# Patient Record
Sex: Male | Born: 1985 | Race: Black or African American | Hispanic: No | Marital: Married | Smoking: Never smoker
Health system: Southern US, Community
[De-identification: ages and names within clinical notes are randomized; demographics above are authoritative.]

---

## 2019-05-11 ENCOUNTER — Encounter (HOSPITAL_COMMUNITY): Payer: Self-pay

## 2019-05-11 ENCOUNTER — Ambulatory Visit (HOSPITAL_COMMUNITY)
Admission: EM | Admit: 2019-05-11 | Discharge: 2019-05-11 | Disposition: A | Discharge status: Home / Self Care | Payer: Self-pay | Attending: Family Medicine | Admitting: Family Medicine

## 2019-05-11 ENCOUNTER — Other Ambulatory Visit: Payer: Self-pay

## 2019-05-11 ENCOUNTER — Ambulatory Visit (INDEPENDENT_AMBULATORY_CARE_PROVIDER_SITE_OTHER): Payer: Self-pay

## 2019-05-11 DIAGNOSIS — L84 Corns and callosities: Secondary | ICD-10-CM

## 2019-05-11 NOTE — ED Triage Notes (Signed)
Pt presents with foreign body in left foot from over a month ago. Pt is unsure of whether it is glass or something else.

## 2019-05-11 NOTE — Discharge Instructions (Signed)
Xray does not show any visible foreign bodies.  This cannot rule out anything completely, as not all things are detectable by xray.  However, since this has been so long, I do not feel that without confirmation we should be cutting into your foot here today to attempt removal.  This does appear that it may be more like a plantar wart and callus.  Soaks of the foot at night, use of over the counter wart treatment may be tried as well.  I would recommend following up with your primary care provider and/or podiatry if your symptoms persist.

## 2019-05-11 NOTE — ED Provider Notes (Signed)
[facility]    CSN: [id number] Arrival date & time: 05/11/19  5329     History   Chief Complaint Chief Complaint  Patient presents with  . Foreign Body in Foot    HPI Clarence Miller is a 33 y.o. male.   Clarence Miller presents with complaints of pain to plantar aspect of left foot. States some "months" ago he felt that he stepped on something, had pain at the time and maybe even some bleeding. He never saw what he stepped on. He felt like something imbedded into the foot, he tried to remove it with tweezers at that time but was unable to remove anything. States pain has since been increasing. No redness, no drainage. No fevers. Pain with weight bearing. States he is on his feet a lot at work, works for [employer]. Another area near by is now also becoming thick and painful. Denies any previous similar.     ROS per HPI, negative if not otherwise mentioned.      History reviewed. No pertinent past medical history.  There are no active problems to display for this patient.   History reviewed. No pertinent surgical history.     Home Medications    Prior to Admission medications   Not on File    Family History Family History  Family history unknown: Yes    Social History Social History   Tobacco Use  . Smoking status: Never Smoker  . Smokeless tobacco: Never Used  Substance Use Topics  . Alcohol use: Not on file  . Drug use: Not on file     Allergies   Patient has no known allergies.   Review of Systems Review of Systems   Physical Exam Triage Vital Signs ED Triage Vitals  Enc Vitals Group     BP 05/11/19 0914 128/79     Pulse Rate 05/11/19 0914 68     Resp 05/11/19 0914 18     Temp 05/11/19 0914 98.2 F (36.8 C)     Temp Source 05/11/19 0914 Oral     SpO2 05/11/19 0914 97 %     Weight --      Height --      Head Circumference --      Peak Flow --      Pain Score 05/11/19 0915 3     Pain Loc --      Pain Edu? --      Excl. in [place]? --     No data found.  Updated Vital Signs BP 128/79 (BP Location: Right Arm)   Pulse 68   Temp 98.2 F (36.8 C) (Oral)   Resp 18   SpO2 97%    Physical Exam Constitutional:      Appearance: He is well-developed.  Cardiovascular:     Rate and Rhythm: Normal rate.     Pulses:          Dorsalis pedis pulses are 2+ on the left side.  Pulmonary:     Effort: Pulmonary effort is normal.  Musculoskeletal:     Left foot: Normal range of motion.       Feet:  Feet:     Left foot:     Skin integrity: Callus present.     Comments: Two regions of apparent callous to left foot which are tender; more medial area is circular;no drainage or fluctuance; no redness; not raised; firm; no visible or obvious palpable foreign body; callous noted to plantar aspect of great toe  and heel as well Skin:    General: Skin is warm and dry.  Neurological:     Mental Status: He is alert and oriented to person, place, and time.      UC Treatments / Results  Labs (all labs ordered are listed, but only abnormal results are displayed) Labs Reviewed - No data to display  EKG   Radiology Dg Foot Complete Left  Result Date: 05/11/2019 CLINICAL DATA:  Left foot pain for 2 months. Possible foreign body. Stepped on an object. EXAM: LEFT FOOT - COMPLETE 3+ VIEW COMPARISON:  None. FINDINGS: The joint spaces are maintained. No acute bony findings or destructive bony changes. No radiopaque foreign body or gas in the soft tissues. IMPRESSION: No acute bony findings or radiopaque foreign body. Electronically Signed   By: Rudie MeyerP.  Gallerani M.D.   On: 05/11/2019 09:41    Procedures Procedures (including critical care time)  Medications Ordered in UC Medications - No data to display  Initial Impression / Assessment and Plan / UC Course  I have reviewed the triage vital signs and the nursing notes.  Pertinent labs & imaging results that were available during my care of the patient were reviewed by me and considered  in my medical decision making (see chart for details).     No visible FB on xray, doesn't completely rule out possibility, discussed this with patient. Recommended starting out with callous and wart treatment to see if any benefits, follow up with podiatry recommended. Patient verbalized understanding and agreeable to plan.  Ambulatory out of clinic without difficulty.    Final Clinical Impressions(s) / UC Diagnoses   Final diagnoses:  Callus of foot     Discharge Instructions     Xray does not show any visible foreign bodies.  This cannot rule out anything completely, as not all things are detectable by xray.  However, since this has been so long, I do not feel that without confirmation we should be cutting into your foot here today to attempt removal.  This does appear that it may be more like a plantar wart and callus.  Soaks of the foot at night, use of over the counter wart treatment may be tried as well.  I would recommend following up with your primary care provider and/or podiatry if your symptoms persist.     ED Prescriptions    None     Controlled Substance Prescriptions [place] Controlled Substance Registry consulted? Not Applicable   Georgetta HaberBurky, [place] L[place] B, NP 05/11/19 1045

## 2020-06-20 IMAGING — DX LEFT FOOT - COMPLETE 3+ VIEW
3 series · 3 of 3 positions shown · non-contrast
Comparison: None.

CLINICAL DATA: Left foot pain for 2 months. Possible foreign body.
Stepped on an object.

EXAM:
LEFT FOOT - COMPLETE 3+ VIEW

[foot ap]
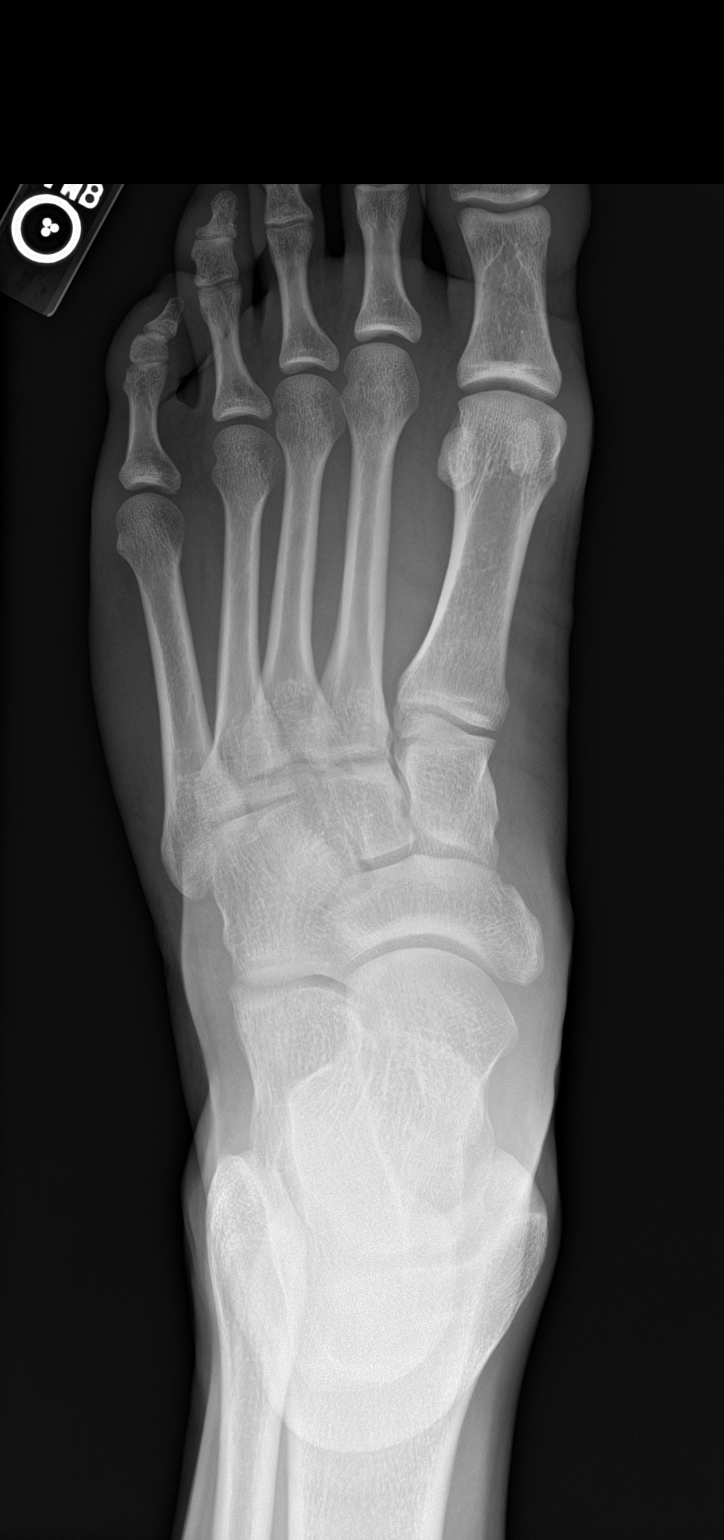

[foot obl]
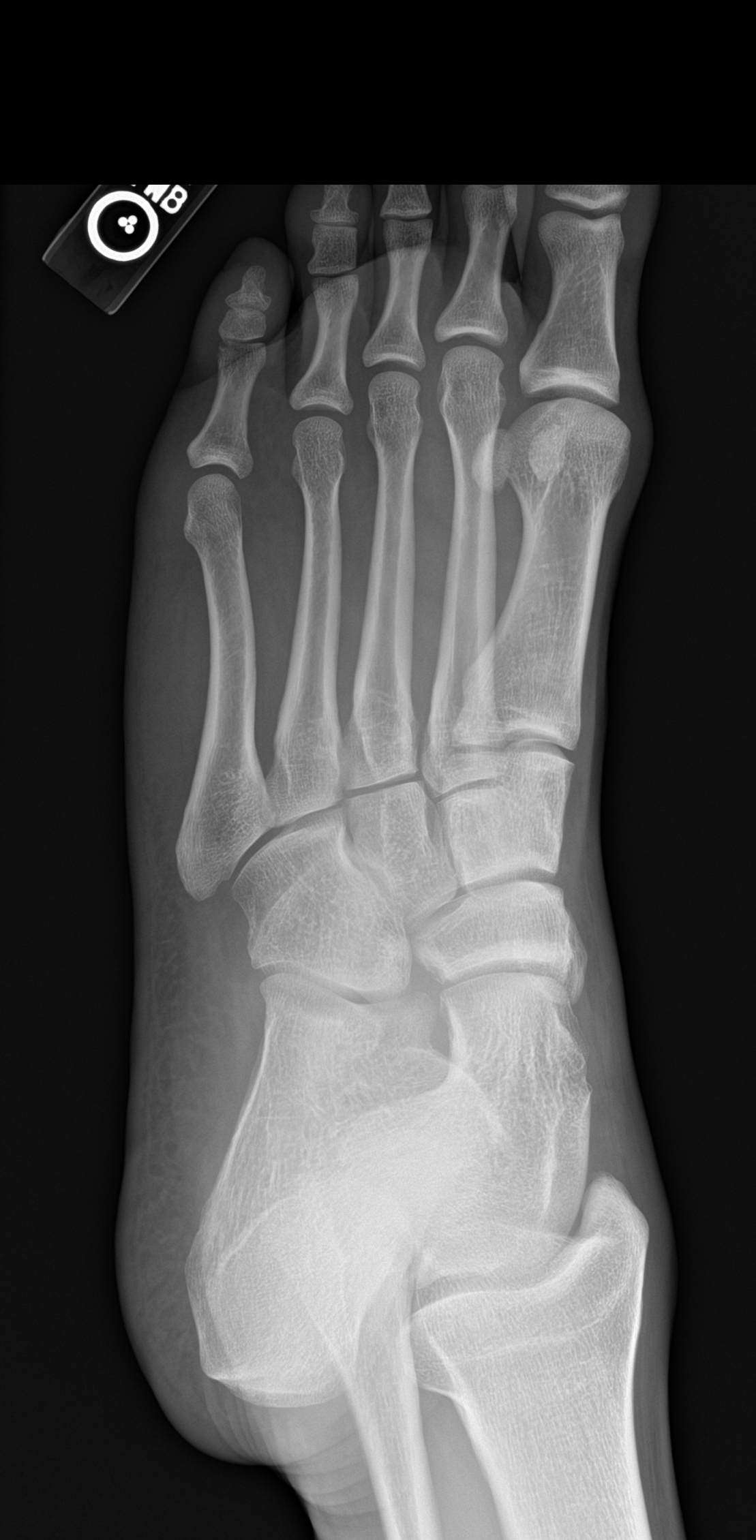

[foot lat]
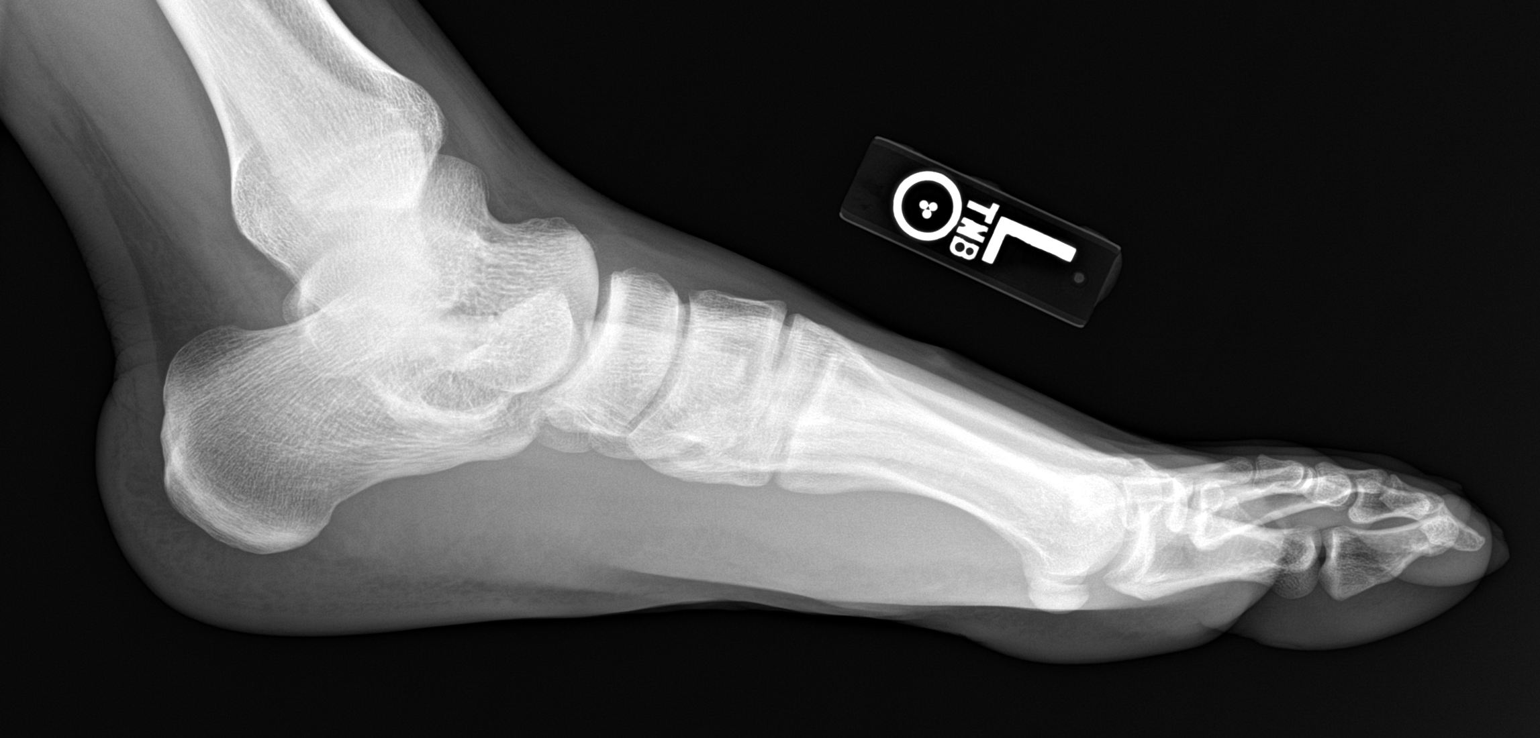

[3 of 3 positions shown; findings below may reference images not displayed]

FINDINGS: The joint spaces are maintained. No acute bony findings or
destructive bony changes. No radiopaque foreign body or gas in the
soft tissues.
IMPRESSION: No acute bony findings or radiopaque foreign body.

## 2021-06-19 DIAGNOSIS — J309 Allergic rhinitis, unspecified: Secondary | ICD-10-CM | POA: Diagnosis not present

## 2021-10-07 ENCOUNTER — Encounter: Payer: Self-pay | Admitting: Nurse Practitioner

## 2021-10-07 ENCOUNTER — Ambulatory Visit (INDEPENDENT_AMBULATORY_CARE_PROVIDER_SITE_OTHER): Payer: Federal, State, Local not specified - PPO | Admitting: Nurse Practitioner

## 2021-10-07 ENCOUNTER — Other Ambulatory Visit: Payer: Self-pay

## 2021-10-07 VITALS — BP 110/88 | HR 64 | Temp 97.7°F | Ht 71.5 in | Wt 215.2 lb

## 2021-10-07 DIAGNOSIS — H53009 Unspecified amblyopia, unspecified eye: Secondary | ICD-10-CM | POA: Insufficient documentation

## 2021-10-07 DIAGNOSIS — Z136 Encounter for screening for cardiovascular disorders: Secondary | ICD-10-CM | POA: Diagnosis not present

## 2021-10-07 DIAGNOSIS — Z0000 Encounter for general adult medical examination without abnormal findings: Secondary | ICD-10-CM | POA: Diagnosis not present

## 2021-10-07 DIAGNOSIS — H5210 Myopia, unspecified eye: Secondary | ICD-10-CM | POA: Insufficient documentation

## 2021-10-07 DIAGNOSIS — F419 Anxiety disorder, unspecified: Secondary | ICD-10-CM | POA: Insufficient documentation

## 2021-10-07 DIAGNOSIS — F902 Attention-deficit hyperactivity disorder, combined type: Secondary | ICD-10-CM | POA: Insufficient documentation

## 2021-10-07 DIAGNOSIS — H52229 Regular astigmatism, unspecified eye: Secondary | ICD-10-CM | POA: Insufficient documentation

## 2021-10-07 DIAGNOSIS — Q120 Congenital cataract: Secondary | ICD-10-CM

## 2021-10-07 DIAGNOSIS — Z13220 Encounter for screening for lipoid disorders: Secondary | ICD-10-CM | POA: Diagnosis not present

## 2021-10-07 DIAGNOSIS — J302 Other seasonal allergic rhinitis: Secondary | ICD-10-CM | POA: Insufficient documentation

## 2021-10-07 DIAGNOSIS — G4700 Insomnia, unspecified: Secondary | ICD-10-CM | POA: Insufficient documentation

## 2021-10-07 HISTORY — DX: Congenital cataract: Q12.0

## 2021-10-07 HISTORY — DX: Anxiety disorder, unspecified: F41.9

## 2021-10-07 NOTE — Progress Notes (Signed)
Subjective:    Patient ID: Clarence Miller, male    DOB: 05/07/1986, 36 y.o.   MRN: [id number]  Patient presents today for CPE  HPI  [facility].  Vision:up to date Dental:will schedule Diet:regular Exercise:4x/week, cardio and weight training Weight:  Wt Readings from Last 3 Encounters:  10/07/21 215 lb 3.2 oz (97.6 kg)    Sexual History (orientation,birth control, marital status, STD):no need for STD screen  Depression/Suicide: Depression screen [facility] 2/9 10/07/2021  Decreased Interest 0  Down, Depressed, Hopeless 0  PHQ - 2 Score 0   Immunizations: (TDAP, Hep C screen, Pneumovax, Influenza, zoster)  Health Maintenance  Topic Date Due   Pneumococcal Vaccination (1 - PCV) Never done   COVID-19 Vaccine (3 - Booster for Pfizer series) 04/16/2020   Hepatitis C Screening: USPSTF Recommendation to screen - Ages 18-79 yo.  10/07/2022*   HIV Screening  10/07/2022*   Tetanus Vaccine  03/08/2030   Flu Shot  Completed   HPV Vaccine  Aged Out  *Topic was postponed. The date shown is not the original due date.   Fall Risk: Fall Risk  10/07/2021  Falls in the past year? 0  Number falls in past yr: 0  Injury with Fall? 0  Risk for fall due to : No Fall Risks  Follow up Falls evaluation completed   Medications and allergies reviewed with patient and updated if appropriate.  Patient Active Problem List   Diagnosis Date Noted   Attention deficit hyperactivity disorder, combined type 10/07/2021   Amblyopia 10/07/2021   Anxiety disorder, unspecified 10/07/2021   Congenital cataract 10/07/2021   Insomnia 10/07/2021   Myopia 10/07/2021   Seasonal allergic rhinitis 10/07/2021   Regular astigmatism 10/07/2021    Current Outpatient Medications on File Prior to Visit  Medication Sig Dispense Refill   baclofen (LIORESAL) 10 MG tablet Take 10 mg by mouth 3 (three) times daily.     loratadine (CLARITIN) 10 MG tablet Take 1 tablet by mouth daily.      No current facility-administered medications on file prior to visit.   Past Medical History:  Diagnosis Date   Anxiety disorder, unspecified 10/07/2021   Congenital cataract 10/07/2021   No past surgical history on file.  Social History   Socioeconomic History   Marital status: Married    Spouse name: Not on file   Number of children: Not on file   Years of education: Not on file   Highest education level: Not on file  Occupational History   Not on file  Tobacco Use   Smoking status: Never   Smokeless tobacco: Never  Substance and Sexual Activity   Alcohol use: Never   Drug use: Never   Sexual activity: Yes    Birth control/protection: Coitus interruptus  Other Topics Concern   Not on file  Social History Narrative   Not on file   Social Determinants of Health   Financial Resource Strain: Not on file  Food Insecurity: Not on file  Transportation Needs: Not on file  Physical Activity: Not on file  Stress: Not on file  Social Connections: Not on file   Family History  Family history unknown: Yes       Review of Systems  Constitutional:  Negative for fever, malaise/fatigue and weight loss.  HENT:  Negative for congestion and sore throat.   Eyes:        Negative for visual changes  Respiratory:  Negative for cough  and shortness of breath.   Cardiovascular:  Negative for chest pain, palpitations and leg swelling.  Gastrointestinal:  Negative for blood in stool, constipation, diarrhea and heartburn.  Genitourinary:  Negative for dysuria, frequency and urgency.  Musculoskeletal:  Negative for falls, joint pain and myalgias.  Skin:  Negative for rash.  Neurological:  Negative for dizziness, sensory change and headaches.  Endo/Heme/Allergies:  Does not bruise/bleed easily.  Psychiatric/Behavioral:  Negative for depression, substance abuse and suicidal ideas. The patient is not nervous/anxious.    Objective:   Vitals:   10/07/21 1013  BP: 110/88  Pulse: 64   Temp: 97.7 F (36.5 C)  SpO2: 98%   Body mass index is 29.6 kg/m.  Physical Examination:  Physical Exam Vitals reviewed.  Constitutional:      General: He is not in acute distress.    Appearance: He is well-developed.  HENT:     Right Ear: Tympanic membrane, ear canal and external ear normal.     Left Ear: Tympanic membrane, ear canal and external ear normal.  Eyes:     Extraocular Movements: Extraocular movements intact.     Conjunctiva/sclera: Conjunctivae normal.  Cardiovascular:     Rate and Rhythm: Normal rate and regular rhythm.     Pulses: Normal pulses.     Heart sounds: Normal heart sounds.  Pulmonary:     Effort: Pulmonary effort is normal. No respiratory distress.     Breath sounds: Normal breath sounds.  Chest:     Chest wall: No tenderness.  Abdominal:     General: Bowel sounds are normal.     Palpations: Abdomen is soft.  Musculoskeletal:        General: Normal range of motion.     Cervical back: Normal range of motion and neck supple.     Right lower leg: No edema.     Left lower leg: No edema.  Skin:    General: Skin is warm and dry.  Neurological:     Mental Status: He is alert and oriented to person, place, and time.     Deep Tendon Reflexes: Reflexes are normal and symmetric.  Psychiatric:        Mood and Affect: Mood normal.        Behavior: Behavior normal.        Thought Content: Thought content normal.   ASSESSMENT and PLAN: This visit occurred during the SARS-CoV-2 public health emergency.  Safety protocols were in place, including screening questions prior to the visit, additional usage of staff PPE, and extensive cleaning of exam room while observing appropriate contact time as indicated for disinfecting solutions.   Clarence Miller was seen today for establish care.  Diagnoses and all orders for this visit:  Preventative health care -     CBC with Differential/Platelet; Future -     Comprehensive metabolic panel; Future -     TSH; Future -      Lipid panel; Future  Encounter for lipid screening for cardiovascular disease -     Lipid panel; Future      Problem List Items Addressed This Visit   None Visit Diagnoses     Preventative health care    -  Primary   Relevant Orders   CBC with Differential/Platelet   Comprehensive metabolic panel   TSH   Lipid panel   Encounter for lipid screening for cardiovascular disease       Relevant Orders   Lipid panel       Follow up:  Return in about 1 year (around 10/07/2022) for CPE (fasting).  Alysia Penna, NP

## 2021-10-07 NOTE — Patient Instructions (Signed)
Thank you for choosing  primary care  Schedule lab appt for fasting blood draw. Need to be fasting 8hrs prior to blood draw. Ok to drink water  Preventive Care 6-34 Years Old, Male Preventive care refers to lifestyle choices and visits with your health care provider that can promote health and wellness. Preventive care visits are also called wellness exams. What can I expect for my preventive care visit? Counseling During your preventive care visit, your health care provider may ask about your: Medical history, including: Past medical problems. Family medical history. Current health, including: Emotional well-being. Home life and relationship well-being. Sexual activity. Lifestyle, including: Alcohol, nicotine or tobacco, and drug use. Access to firearms. Diet, exercise, and sleep habits. Safety issues such as seatbelt and bike helmet use. Sunscreen use. Work and work Astronomer. Physical exam Your health care provider may check your: Height and weight. These may be used to calculate your BMI (body mass index). BMI is a measurement that tells if you are at a healthy weight. Waist circumference. This measures the distance around your waistline. This measurement also tells if you are at a healthy weight and may help predict your risk of certain diseases, such as type 2 diabetes and high blood pressure. Heart rate and blood pressure. Body temperature. Skin for abnormal spots. What immunizations do I need? Vaccines are usually given at various ages, according to a schedule. Your health care provider will recommend vaccines for you based on your age, medical history, and lifestyle or other factors, such as travel or where you work. What tests do I need? Screening Your health care provider may recommend screening tests for certain conditions. This may include: Lipid and cholesterol levels. Diabetes screening. This is done by checking your blood sugar (glucose) after you have  not eaten for a while (fasting). Hepatitis B test. Hepatitis C test. HIV (human immunodeficiency virus) test. STI (sexually transmitted infection) testing, if you are at risk. Talk with your health care provider about your test results, treatment options, and if necessary, the need for more tests. Follow these instructions at home: Eating and drinking  Eat a healthy diet that includes fresh fruits and vegetables, whole grains, lean protein, and low-fat dairy products. Drink enough fluid to keep your urine pale yellow. Take vitamin and mineral supplements as recommended by your health care provider. Do not drink alcohol if your health care provider tells you not to drink. If you drink alcohol: Limit how much you have to 0-2 drinks a day. Know how much alcohol is in your drink. In the [place] one drink equals one 12 oz bottle of beer (355 mL), one 5 oz glass of wine (148 mL), or one 1 oz glass of hard liquor (44 mL). Lifestyle Brush your teeth every morning and night with fluoride toothpaste. Floss one time each day. Exercise for at least 30 minutes 5 or more days each week. Do not use any products that contain nicotine or tobacco. These products include cigarettes, chewing tobacco, and vaping devices, such as e-cigarettes. If you need help quitting, ask your health care provider. Do not use drugs. If you are sexually active, practice safe sex. Use a condom or other form of protection to prevent STIs. Find healthy ways to manage stress, such as: Meditation, yoga, or listening to music. Journaling. Talking to a trusted person. Spending time with friends and family. Minimize exposure to UV radiation to reduce your risk of skin cancer. Safety Always wear your seat belt while driving or riding in  a vehicle. Do not drive: If you have been drinking alcohol. Do not ride with someone who has been drinking. If you have been using any mind-altering substances or drugs. While texting. When you  are tired or distracted. Wear a helmet and other protective equipment during sports activities. If you have firearms in your house, make sure you follow all gun safety procedures. Seek help if you have been physically or sexually abused. What's next? Go to your health care provider once a year for an annual wellness visit. Ask your health care provider how often you should have your eyes and teeth checked. Stay up to date on all vaccines. This information is not intended to replace advice given to you by your health care provider. Make sure you discuss any questions you have with your health care provider. Document Revised: 03/18/2021 Document Reviewed: 03/18/2021 Elsevier Patient Education  2022 [employer].

## 2021-10-08 ENCOUNTER — Other Ambulatory Visit (INDEPENDENT_AMBULATORY_CARE_PROVIDER_SITE_OTHER): Payer: Federal, State, Local not specified - PPO

## 2021-10-08 DIAGNOSIS — Z0000 Encounter for general adult medical examination without abnormal findings: Secondary | ICD-10-CM

## 2021-10-08 DIAGNOSIS — Z13220 Encounter for screening for lipoid disorders: Secondary | ICD-10-CM | POA: Diagnosis not present

## 2021-10-08 DIAGNOSIS — Z136 Encounter for screening for cardiovascular disorders: Secondary | ICD-10-CM

## 2021-10-08 LAB — COMPREHENSIVE METABOLIC PANEL
ALT: 26 U/L (ref 0–53)
AST: 22 U/L (ref 0–37)
Albumin: 4.4 g/dL (ref 3.5–5.2)
Alkaline Phosphatase: 41 U/L (ref 39–117)
BUN: 16 mg/dL (ref 6–23)
CO2: 27 mEq/L (ref 19–32)
Calcium: 9.3 mg/dL (ref 8.4–10.5)
Chloride: 102 mEq/L (ref 96–112)
Creatinine, Ser: 1.24 mg/dL (ref 0.40–1.50)
GFR: 76.26 mL/min (ref >60.00)
Glucose, Bld: 81 mg/dL (ref 70–99)
Potassium: 4.1 mEq/L (ref 3.5–5.1)
Sodium: 136 mEq/L (ref 135–145)
Total Bilirubin: 0.9 mg/dL (ref 0.2–1.2)
Total Protein: 7.1 g/dL (ref 6.0–8.3)

## 2021-10-08 LAB — CBC WITH DIFFERENTIAL/PLATELET
Basophils Absolute: 0 10*3/uL (ref 0.0–0.1)
Basophils Relative: 0.8 % (ref 0.0–3.0)
Eosinophils Absolute: 0.1 10*3/uL (ref 0.0–0.7)
Eosinophils Relative: 2.3 % (ref 0.0–5.0)
HCT: 41.7 % (ref 39.0–52.0)
Hemoglobin: 13.7 g/dL (ref 13.0–17.0)
Lymphocytes Relative: 31.2 % (ref 12.0–46.0)
Lymphs Abs: 1.5 10*3/uL (ref 0.7–4.0)
MCHC: 32.8 g/dL (ref 30.0–36.0)
MCV: 90 fl (ref 78.0–100.0)
Monocytes Absolute: 0.5 10*3/uL (ref 0.1–1.0)
Monocytes Relative: 10.6 % (ref 3.0–12.0)
Neutro Abs: 2.7 10*3/uL (ref 1.4–7.7)
Neutrophils Relative %: 55.1 % (ref 43.0–77.0)
Platelets: 202 10*3/uL (ref 150.0–400.0)
RBC: 4.64 Mil/uL (ref 4.22–5.81)
RDW: 13.2 % (ref 11.5–15.5)
WBC: 4.9 10*3/uL (ref 4.0–10.5)

## 2021-10-08 LAB — LIPID PANEL
Cholesterol: 160 mg/dL (ref 0–200)
HDL: 56.1 mg/dL (ref >39.00)
LDL Cholesterol: 91 mg/dL (ref 0–99)
NonHDL: 103.9
Total CHOL/HDL Ratio: 3
Triglycerides: 65 mg/dL (ref 0.0–149.0)
VLDL: 13 mg/dL (ref 0.0–40.0)

## 2021-10-08 LAB — TSH: TSH: 2.04 u[IU]/mL (ref 0.35–5.50)

## 2023-05-13 ENCOUNTER — Telehealth: Payer: Self-pay | Admitting: Nurse Practitioner

## 2023-05-13 NOTE — Telephone Encounter (Signed)
Patient was last seen 01.2023 and left a voicemail to schedule annual physical
